# Patient Record
Sex: Male | Born: 2000 | Race: White | Hispanic: No | Marital: Single | Smoking: Current every day smoker
Health system: Southern US, Community
[De-identification: ages and names within clinical notes are randomized; demographics above are authoritative.]

## PROBLEM LIST (undated history)

## (undated) DIAGNOSIS — F329 Major depressive disorder, single episode, unspecified: Secondary | ICD-10-CM

## (undated) DIAGNOSIS — F419 Anxiety disorder, unspecified: Secondary | ICD-10-CM

## (undated) DIAGNOSIS — F32A Depression, unspecified: Secondary | ICD-10-CM

---
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
## 2021-01-20 ENCOUNTER — Ambulatory Visit
Admission: EM | Admit: 2021-01-20 | Discharge: 2021-01-20 | Disposition: A | Discharge status: Home / Self Care | Payer: Medicaid Other | Attending: Sports Medicine | Admitting: Sports Medicine

## 2021-01-20 ENCOUNTER — Other Ambulatory Visit: Payer: Self-pay

## 2021-01-20 ENCOUNTER — Encounter: Payer: Self-pay | Admitting: Emergency Medicine

## 2021-01-20 DIAGNOSIS — R350 Frequency of micturition: Secondary | ICD-10-CM | POA: Insufficient documentation

## 2021-01-20 DIAGNOSIS — R300 Dysuria: Secondary | ICD-10-CM | POA: Insufficient documentation

## 2021-01-20 LAB — URINALYSIS, COMPLETE (UACMP) WITH MICROSCOPIC
Bilirubin Urine: NEGATIVE
Glucose, UA: NEGATIVE mg/dL
Hgb urine dipstick: NEGATIVE
Ketones, ur: NEGATIVE mg/dL
Leukocytes,Ua: NEGATIVE
Nitrite: NEGATIVE
Protein, ur: NEGATIVE mg/dL
Specific Gravity, Urine: 1.025 (ref 1.005–1.030)
pH: 6 (ref 5.0–8.0)

## 2021-01-20 LAB — CHLAMYDIA/NGC RT PCR (ARMC ONLY)
Chlamydia Tr: NOT DETECTED
N gonorrhoeae: NOT DETECTED

## 2021-01-20 NOTE — Discharge Instructions (Signed)
The urinalysis was normal.  We will send the urine for culture to see if any bacteria grows.  We have also tested you for sexually transmitted infections and the results to be back later today.  We will give you a call once the results are in.  At this time, increase rest and fluids and you can consider taking over-the-counter AZO for symptoms.  If you have any worsening symptoms, especially worsening testicular pain you need to be seen again immediately.

## 2021-01-20 NOTE — ED Triage Notes (Signed)
Patient c/o burning when urinating and urinary frequency that started 2 days ago.  Patient denies blood in his urine.

## 2021-01-20 NOTE — ED Provider Notes (Addendum)
[facility]    CSN: [id number] Arrival date & time: 01/20/21  0807      History   Chief Complaint Chief Complaint  Patient presents with  . Urinary Frequency  . Dysuria    HPI Adam Mason is a 20 y.o. male presenting for approximately 2-day history of dysuria and urinary frequency.  He also admits to some mild right-sided testicular pain.  Patient denies any fever, fatigue, chills, abdominal/pelvic pain, back pain, hematuria, penile discharge, testicular swelling, skin lesions or rashes.  Patient says he believes he has a UTI.  He says he has had a UTI in the past and took amoxicillin for it.  He is sexually active with 1 male partner and they do not always use protection.  Patient states that his partner is "clean."  He has not taken any OTC meds for symptoms.  He has no other complaints or concerns.  HPI  Past Medical History:  Diagnosis Date  . Anxiety   . Depression     Patient Active Problem List   Diagnosis Date Noted  . 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 GAD (generalized anxiety disorder) 01/10/2015  . Suicidal ideation   . Acute nonsuppurative otitis media of right ear 06/08/2014  . URI (upper respiratory infection) 04/27/2014  . Well child check 11/17/2013    History reviewed. No pertinent surgical history.     Home Medications    Prior to Admission medications   Medication Sig Start Date End Date Taking? Authorizing Provider  cetirizine-pseudoephedrine (ZYRTEC-D) 5-120 MG tablet Take 1 tablet by mouth daily. 11/18/18   Wurst, [place], PA-C  fluticasone (FLONASE) 50 MCG/ACT nasal spray Place 2 sprays into both nostrils daily. 11/18/18   Rennis Harding, PA-C    Family History Family History  Problem Relation Age of Onset  . Kidney disease Mother        kidney stones  . Cancer Maternal Grandmother        breaast  . Kidney disease Maternal Grandmother        stones  . Diabetes Maternal Grandfather   . Asthma Sister    . Asthma Maternal Aunt     Social History Social History   Tobacco Use  . Smoking status: Never Smoker  . Smokeless tobacco: Never Used  Vaping Use  . Vaping Use: Every day  Substance Use Topics  . Alcohol use: No    Alcohol/week: 0.0 standard drinks  . Drug use: No     Allergies   Patient has no known allergies.   Review of Systems Review of Systems  Constitutional: Negative for fatigue and fever.  Gastrointestinal: Negative for abdominal pain, nausea and vomiting.  Genitourinary: Positive for dysuria, frequency and testicular pain. Negative for genital sores, hematuria, penile discharge, penile pain, penile swelling, scrotal swelling and urgency.  Musculoskeletal: Negative for arthralgias.  Skin: Negative for rash.     Physical Exam Triage Vital Signs ED Triage Vitals [01/20/21 0819]  Enc Vitals Group     BP      Pulse      Resp      Temp      Temp src      SpO2      Weight 230 lb (104.3 kg)     Height 6' (1.829 m)     Head Circumference      Peak Flow      Pain Score 3     Pain Loc      Pain Edu?  Excl. in GC?    No data found.  Updated Vital Signs BP 136/78 (BP Location: Left Arm)   Pulse 65   Temp 98.2 F (36.8 C) (Oral)   Resp 16   Ht 6' (1.829 m)   Wt 230 lb (104.3 kg)   SpO2 98%   BMI 31.19 kg/m       Physical Exam Vitals and nursing note reviewed.  Constitutional:      General: He is not in acute distress.    Appearance: Normal appearance. He is well-developed. He is not ill-appearing or diaphoretic.  HENT:     Head: Normocephalic and atraumatic.     Mouth/Throat:     Pharynx: Uvula midline.     Tonsils: No tonsillar abscesses.  Eyes:     General: No scleral icterus.       Right eye: No discharge.        Left eye: No discharge.     Conjunctiva/sclera: Conjunctivae normal.  Neck:     Thyroid: No thyromegaly.     Trachea: No tracheal deviation.  Cardiovascular:     Rate and Rhythm: Normal rate and regular rhythm.      Heart sounds: Normal heart sounds.  Pulmonary:     Effort: Pulmonary effort is normal. No respiratory distress.     Breath sounds: Normal breath sounds. No wheezing, rhonchi or rales.  Abdominal:     Palpations: Abdomen is soft.     Tenderness: There is no abdominal tenderness. There is no right CVA tenderness or left CVA tenderness.  Musculoskeletal:     Cervical back: Neck supple.  Skin:    General: Skin is warm and dry.     Findings: No rash.  Neurological:     General: No focal deficit present.     Mental Status: He is alert. Mental status is at baseline.     Motor: No weakness.     Gait: Gait normal.  Psychiatric:        Mood and Affect: Mood normal.        Behavior: Behavior normal.        Thought Content: Thought content normal.      UC Treatments / Results  Labs (all labs ordered are listed, but only abnormal results are displayed) Labs Reviewed  URINALYSIS, COMPLETE (UACMP) WITH MICROSCOPIC - Abnormal; Notable for the following components:      Result Value   Bacteria, UA FEW (*)    All other components within normal limits  URINE CULTURE  CHLAMYDIA/NGC RT PCR ([facility] ONLY)  TRICHOMONAS VAGINALIS, PROBE AMP    EKG   Radiology No results found.  Procedures Procedures (including critical care time)  Medications Ordered in UC Medications - No data to display  Initial Impression / Assessment and Plan / UC Course  I have reviewed the triage vital signs and the nursing notes.  Pertinent labs & imaging results that were available during my care of the patient were reviewed by me and considered in my medical decision making (see chart for details).   20 year old male presenting with dysuria and urinary frequency x2 days. Patient also admits to some mild right-sided testicular pain.  He states he has had this similar symptoms in the past and has had a UTI and epididymitis.  Patient without any concerns for STIs.  He did decline a GU exam today.  Advised  him that if the testicular pain worsens he should reconsider.  Urinalysis today is negative for any nitrites, blood, leukocytes.  We will send urine for culture.  Also testing for GC/chlamydia/trichomonas.  At this time, advised OTC AZO and increasing rest and fluids.  Reviewed ED precautions with patient.  Advised may consider going ahead and treating him for epididymitis if his STI screen is negative today.  Work note given.   Final Clinical Impressions(s) / UC Diagnoses   Final diagnoses:  Dysuria  Urinary frequency     Discharge Instructions     The urinalysis was normal.  We will send the urine for culture to see if any bacteria grows.  We have also tested you for sexually transmitted infections and the results to be back later today.  We will give you a call once the results are in.  At this time, increase rest and fluids and you can consider taking over-the-counter AZO for symptoms.  If you have any worsening symptoms, especially worsening testicular pain you need to be seen again immediately.    ED Prescriptions    None     PDMP not reviewed this encounter.   Shirlee Latch, PA-C 01/20/21 0849    Shirlee Latch, PA-C 01/20/21 [phone]

## 2021-01-21 LAB — URINE CULTURE: Culture: NO GROWTH

## 2021-01-24 LAB — TRICHOMONAS VAGINALIS, PROBE AMP: Trich vag by NAA: NEGATIVE

## 2021-10-30 ENCOUNTER — Telehealth: Payer: Self-pay

## 2021-10-30 NOTE — Telephone Encounter (Signed)
Left vm to confirm 11/01/21 appointment-Toni °

## 2021-11-01 ENCOUNTER — Ambulatory Visit (INDEPENDENT_AMBULATORY_CARE_PROVIDER_SITE_OTHER): Payer: 59 | Admitting: Nurse Practitioner

## 2021-11-01 ENCOUNTER — Encounter: Payer: Self-pay | Admitting: Nurse Practitioner

## 2021-11-01 ENCOUNTER — Other Ambulatory Visit: Payer: Self-pay

## 2021-11-01 VITALS — BP 136/86 | HR 80 | Temp 98.0°F | Resp 16 | Ht 72.0 in | Wt 230.0 lb

## 2021-11-01 DIAGNOSIS — G4700 Insomnia, unspecified: Secondary | ICD-10-CM | POA: Diagnosis not present

## 2021-11-01 DIAGNOSIS — Z789 Other specified health status: Secondary | ICD-10-CM

## 2021-11-01 DIAGNOSIS — F411 Generalized anxiety disorder: Secondary | ICD-10-CM

## 2021-11-01 DIAGNOSIS — D649 Anemia, unspecified: Secondary | ICD-10-CM | POA: Diagnosis not present

## 2021-11-01 DIAGNOSIS — E782 Mixed hyperlipidemia: Secondary | ICD-10-CM | POA: Diagnosis not present

## 2021-11-01 DIAGNOSIS — Z7689 Persons encountering health services in other specified circumstances: Secondary | ICD-10-CM

## 2021-11-01 DIAGNOSIS — E559 Vitamin D deficiency, unspecified: Secondary | ICD-10-CM

## 2021-11-01 MED ORDER — BELSOMRA 10 MG PO TABS: 10.0000 mg | ORAL_TABLET | Freq: Every evening | ORAL | 2 refills | Status: DC | PRN

## 2021-11-01 NOTE — Progress Notes (Unsigned)
[facility] [place], [place] [address]  Internal MEDICINE  Office Visit Note  Patient Name: Adam Mason  [id number]  [id number]  Date of Service: 11/01/2021   Complaints/HPI Pt is here for establishment of PCP. Chief Complaint  Patient presents with   New Patient (Initial Visit)    Difficulty staying asleep, will only sleep for 2 or 3 hours   Anxiety   Depression    HPI Alfonso presents for a new patient visit to establish care. 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  Diet: improving, trying to lose weight, increased protein, smaller portions. Does not eat out much anymore Exercise: go to gym and work is physically demanding Substances: vape, no smoking, 2-3 times per week, 1-2 glasses of whiskey, stopped taking marijuana, was pretty heavy smoker.  Pain: no pain  Questions or concerns: Age appropriate screenings: Routine labs Difficulty sleeping, staying asleeping going back to sleep, sleeps about 2-3 hours at a time.  Tried benadryl and melatonin, doxylamine and cough syrup.   Current Medication: Outpatient Encounter Medications as of 11/01/2021  Medication Sig   Suvorexant (BELSOMRA) 10 MG TABS Take 10 mg by mouth at bedtime as needed (insomnia). Take 30 minute before bedtime.   [DISCONTINUED] cetirizine-pseudoephedrine (ZYRTEC-D) 5-120 MG tablet Take 1 tablet by mouth daily. (Patient not taking: Reported on 11/01/2021)   [DISCONTINUED] fluticasone (FLONASE) 50 MCG/ACT nasal spray Place 2 sprays into both nostrils daily. (Patient not taking: Reported on 11/01/2021)   No facility-administered encounter medications on file as of 11/01/2021.    Surgical History: History reviewed. No pertinent surgical history.  Medical History: Past Medical History:  Diagnosis Date   Anxiety    Depression     Family  History: Family History  Problem Relation Age of Onset   Kidney disease Mother        kidney stones   Cancer Maternal Grandmother        breaast   Kidney disease Maternal Grandmother        stones   Diabetes Maternal Grandfather    Asthma Sister    Asthma Maternal Aunt     Social History   Socioeconomic History   Marital status: Single    Spouse name: Not on file   Number of children: Not on file   Years of education: Not on file   Highest education level: Not on file  Occupational History   Not on file  Tobacco Use   Smoking status: Every Day    Types: E-cigarettes   Smokeless tobacco: Never  Vaping Use   Vaping Use: Every day  Substance and Sexual Activity   Alcohol use: No    Alcohol/week: 0.0 standard drinks   Drug use: Not Currently    Types: Marijuana   Sexual activity: Not Currently    Birth control/protection: None  Other Topics Concern   Not on file  Social History Narrative   Not on file   Social Determinants of Health   Financial Resource Strain: Not on file  Food Insecurity: Not on file  Transportation Needs: Not on file  Physical Activity: Not on file  Stress: Not on file  Social Connections: Not on file  Intimate Partner Violence: Not on file     Review of Systems  Constitutional:  Negative for chills, fatigue and unexpected weight change.  HENT:  Negative for congestion, rhinorrhea, sneezing  and sore throat.   Eyes:  Negative for redness.  Respiratory:  Negative for cough, chest tightness and shortness of breath.   Cardiovascular:  Negative for chest pain and palpitations.  Gastrointestinal:  Negative for abdominal pain, constipation, diarrhea, nausea and vomiting.  Genitourinary:  Negative for dysuria and frequency.  Musculoskeletal:  Negative for arthralgias, back pain, joint swelling and neck pain.  Skin:  Negative for rash.  Neurological: Negative.  Negative for tremors and numbness.  Hematological:  Negative for  adenopathy. Does not bruise/bleed easily.  Psychiatric/Behavioral:  Positive for depression. Negative for behavioral problems (Depression), sleep disturbance and suicidal ideas. The patient is not nervous/anxious.    Vital Signs: BP 136/86    Pulse 80    Temp 98 F (36.7 C)    Resp 16    Ht 6' (1.829 m)    Wt 230 lb (104.3 kg)    SpO2 98%    BMI 31.19 kg/m    Physical Exam Vitals reviewed.  Constitutional:      General: He is not in acute distress.    Appearance: Normal appearance. He is obese. He is not ill-appearing.  HENT:     Head: Normocephalic and atraumatic.  Eyes:     Pupils: Pupils are equal, round, and reactive to light.  Cardiovascular:     Rate and Rhythm: Normal rate and regular rhythm.  Pulmonary:     Effort: Pulmonary effort is normal. No respiratory distress.  Neurological:     Mental Status: He is alert and oriented to person, place, and time.  Psychiatric:        Mood and Affect: Mood normal.        Behavior: Behavior normal.      Assessment/Plan: There are no diagnoses linked to this encounter.   General Counseling: alic hilburn understanding of the findings of todays visit and agrees with plan of treatment. I have discussed any further diagnostic evaluation that may be needed or ordered today. We also reviewed his medications today. he has been encouraged to call the office with any questions or concerns that should arise related to todays visit.    Counseling:  [place] Controlled Substance Database was reviewed by me.  Orders Placed This Encounter  Procedures   CBC with Differential/Platelet   CMP14+EGFR   Lipid Profile   Vitamin D (25 hydroxy)    Meds ordered this encounter  Medications   Suvorexant (BELSOMRA) 10 MG TABS    Sig: Take 10 mg by mouth at bedtime as needed (insomnia). Take 30 minute before bedtime.    Dispense:  30 tablet    Refill:  2    Return for CPE,  PCP at earliest available opening. .  Time spent:30  Minutes  This patient was seen by Jonetta Osgood, FNP-C in collaboration with Dr. Clayborn Bigness as a part of collaborative care agreement.     R. Valetta Fuller, MSN, FNP-C Internal Medicine

## 2021-11-06 ENCOUNTER — Telehealth: Payer: Self-pay

## 2021-11-06 ENCOUNTER — Other Ambulatory Visit: Payer: Self-pay

## 2021-11-06 MED ORDER — TRAZODONE HCL 50 MG PO TABS: 50.0000 mg | ORAL_TABLET | Freq: Every day | ORAL | 0 refills | Status: DC

## 2021-11-06 NOTE — Telephone Encounter (Signed)
Pt notified that we send trazodone see other note

## 2021-11-08 ENCOUNTER — Telehealth: Payer: Self-pay

## 2021-11-10 NOTE — Telephone Encounter (Signed)
Pt informed that his leeter was ready and can pick up on Monday at front desk ?

## 2021-12-03 ENCOUNTER — Other Ambulatory Visit: Payer: Self-pay | Admitting: Nurse Practitioner

## 2021-12-12 ENCOUNTER — Telehealth: Payer: Self-pay

## 2021-12-12 ENCOUNTER — Encounter: Payer: 59 | Admitting: Nurse Practitioner

## 2021-12-12 DIAGNOSIS — Z0289 Encounter for other administrative examinations: Secondary | ICD-10-CM

## 2021-12-12 NOTE — Telephone Encounter (Signed)
Left voicemail for patient to call and reschedule missed office visit on 12/12/21. ?

## 2023-02-03 ENCOUNTER — Emergency Department
Admission: EM | Admit: 2023-02-03 | Discharge: 2023-02-04 | Disposition: A | Discharge status: Other Acute Inpatient Hospital | Payer: 59 | Attending: Emergency Medicine | Admitting: Emergency Medicine

## 2023-02-03 ENCOUNTER — Other Ambulatory Visit: Payer: Self-pay

## 2023-02-03 DIAGNOSIS — F17210 Nicotine dependence, cigarettes, uncomplicated: Secondary | ICD-10-CM | POA: Insufficient documentation

## 2023-02-03 DIAGNOSIS — R4689 Other symptoms and signs involving appearance and behavior: Secondary | ICD-10-CM

## 2023-02-03 DIAGNOSIS — F411 Generalized anxiety disorder: Secondary | ICD-10-CM | POA: Diagnosis present

## 2023-02-03 DIAGNOSIS — F4323 Adjustment disorder with mixed anxiety and depressed mood: Secondary | ICD-10-CM | POA: Insufficient documentation

## 2023-02-03 LAB — URINE DRUG SCREEN, QUALITATIVE (ARMC ONLY)
Amphetamines, Ur Screen: NOT DETECTED
Barbiturates, Ur Screen: NOT DETECTED
Benzodiazepine, Ur Scrn: NOT DETECTED
Cannabinoid 50 Ng, Ur ~~LOC~~: POSITIVE — AB
Cocaine Metabolite,Ur ~~LOC~~: NOT DETECTED
MDMA (Ecstasy)Ur Screen: NOT DETECTED
Methadone Scn, Ur: NOT DETECTED
Opiate, Ur Screen: NOT DETECTED
Phencyclidine (PCP) Ur S: NOT DETECTED
Tricyclic, Ur Screen: NOT DETECTED

## 2023-02-03 LAB — CBC
HCT: 49.7 % (ref 39.0–52.0)
Hemoglobin: 16.8 g/dL (ref 13.0–17.0)
MCH: 30 pg (ref 26.0–34.0)
MCHC: 33.8 g/dL (ref 30.0–36.0)
MCV: 88.8 fL (ref 80.0–100.0)
Platelets: 244 10*3/uL (ref 150–400)
RBC: 5.6 MIL/uL (ref 4.22–5.81)
RDW: 11.9 % (ref 11.5–15.5)
WBC: 14 10*3/uL — ABNORMAL HIGH (ref 4.0–10.5)
nRBC: 0 % (ref 0.0–0.2)

## 2023-02-03 LAB — COMPREHENSIVE METABOLIC PANEL
ALT: 24 U/L (ref 0–44)
AST: 22 U/L (ref 15–41)
Albumin: 5 g/dL (ref 3.5–5.0)
Alkaline Phosphatase: 58 U/L (ref 38–126)
Anion gap: 8 (ref 5–15)
BUN: 15 mg/dL (ref 6–20)
CO2: 25 mmol/L (ref 22–32)
Calcium: 9.2 mg/dL (ref 8.9–10.3)
Chloride: 103 mmol/L (ref 98–111)
Creatinine, Ser: 0.9 mg/dL (ref 0.61–1.24)
GFR, Estimated: >60 mL/min (ref >60)
Glucose, Bld: 124 mg/dL — ABNORMAL HIGH (ref 70–99)
Potassium: 3.9 mmol/L (ref 3.5–5.1)
Sodium: 136 mmol/L (ref 135–145)
Total Bilirubin: 0.8 mg/dL (ref 0.3–1.2)
Total Protein: 7.9 g/dL (ref 6.5–8.1)

## 2023-02-03 LAB — ETHANOL: Alcohol, Ethyl (B): <10 mg/dL (ref <10)

## 2023-02-03 LAB — SALICYLATE LEVEL: Salicylate Lvl: <7 mg/dL — ABNORMAL LOW (ref 7.0–30.0)

## 2023-02-03 LAB — ACETAMINOPHEN LEVEL: Acetaminophen (Tylenol), Serum: <10 ug/mL — ABNORMAL LOW (ref 10–30)

## 2023-02-03 MED ORDER — NICOTINE 21 MG/24HR TD PT24
21.0000 mg | MEDICATED_PATCH | Freq: Once | TRANSDERMAL | Status: DC
  Administered 2023-02-03: 21 mg via TRANSDERMAL

## 2023-02-03 NOTE — BH Assessment (Signed)
Comprehensive Clinical Assessment (CCA) Note  02/03/2023 Adam Mason [id number]  Chief Complaint:  Chief Complaint  Patient presents with   Psychiatric Evaluation    IVC   Visit Diagnosis: Adjustment Disorder   Adam Mason is a 22 year old male who presents to the ER via law enforcement, under IVC. Per the IVC, the patient threatened to end his life, by shooting his self with his gun. Per the patient, he never said it. He went to his girlfriend house because she ended their relationship, via a text message. He stated he wanted her to tell him face-to-face it was over. However, her parents told him to leave, so he left. Patient states, he was confused about the text, because he recently spent a great deal of money on her, when they were out of town together. Last night the discussed plans about moving in with each other. After the patient left her home, he went to his best friend's house, to spend time with him and his family. He states he never had the thought of hurting his self and never said anything that would cause anyone to believe he would.  Per the permission of the patient, writer contacted the friend Adam Mason [phone]) and he confirmed the recent breakup. As well as confirming the patient came to his home, after leaving the girlfriend's home. He shared the patient was sad about the breakup, which he understood why he would be. "It wasn't nothing outside of would be normal breakup. He was hurt." He reports, the patient never said anything about harming himself or anyone. He further reported, he has no concerns of the patient doing anything to harm him self or anyone else. "In fact, we made plans to go to [facility] next weekend to get the rest of my stuff and move it down here ([place])." Per the friend, they have known each other for approximately four years and they are extremely close. He's known the friend's wife since middle school and they are close as well. They talk two to three times a  week via phone. They see each other a minimum of once a week, to hangout. "He's even my kid's Godfather."  During the interview, the patient was calm, cooperative and pleasant. He was able to provide appropriate answers to the questions. Throughout the interview, he denied SI/HI and AV/H. He acknowledges his history of past inpatient treatments. He also acknowledges his THC use and denies the use of anything else. UDS reflects the same. Patient voice his concern about having to stay at the hospital and getting billed for it. His grandfather passed approximately three months ago, and he is living in his home. The hospital bills for the grandfather are still coming to the house, and he is trying to pay but he recently lost his job. The company went out of business.  CCA Screening, Triage and Referral (STR)  Patient Reported Information How did you hear about [place]? Family/Friend  What Is the Reason for Your Visit/Call Today? Per IVC, patient voiced SI with plan to use his gun.  How Long Has This Been Causing You Problems? <Week  What Do You Feel Would Help You the Most Today? Treatment for Depression or other mood problem   Have You Recently Had Any Thoughts About Hurting Yourself? No  Are You Planning to Commit Suicide/Harm Yourself At This time? No   Flowsheet Row ED from 02/03/2023 in [facility] Emergency Department at [facility] ED from 01/20/2021 in [facility] Urgent Care at [facility]  C-SSRS RISK CATEGORY No Risk No Risk       Have you Recently Had Thoughts About Hurting Someone Adam Mason? No  Are You Planning to Harm Someone at This Time? No  Explanation: No data recorded  Have You Used Any Alcohol or Drugs in the Past 24 Hours? Yes  What Did You Use and How Much? Cannabis  Do You Currently Have a Therapist/Psychiatrist? No  Name of Therapist/Psychiatrist:    Have You Been Recently Discharged From Any Office Practice or Programs? No  Explanation of Discharge From  Practice/Program: No data recorded    CCA Screening Triage Referral Assessment Type of Contact: Face-to-Face  Telemedicine Service Delivery:   Is this Initial or Reassessment?   Date Telepsych consult ordered in [facility]:    Time Telepsych consult ordered in [facility]:    Location of Assessment: [facility] ED  Provider Location: [facility] ED  Collateral Involvement: No data recorded  Does Patient Have a Court Appointed Legal Guardian? No  Legal Guardian Contact Information: No data recorded Copy of Legal Guardianship Form: No data recorded Legal Guardian Notified of Arrival: No data recorded Legal Guardian Notified of Pending Discharge: No data recorded If Minor and Not Living with Parent(s), Who has Custody? No data recorded Is CPS involved or ever been involved? Never  Is APS involved or ever been involved? Never  Patient Determined To Be At Risk for Harm To Self or Others Based on Review of Patient Reported Information or Presenting Complaint? No  Method: No data recorded Availability of Means: No data recorded Intent: No data recorded Notification Required: No data recorded Additional Information for Danger to Others Potential: No data recorded Additional Comments for Danger to Others Potential: No data recorded Are There Guns or Other Weapons in Your Home? Yes  Types of Guns/Weapons: Own four fire arms.  Are These Weapons Safely Secured?                            No data recorded Who Could Verify You Are Able To Have These Secured: No data recorded Do You Have any Outstanding Charges, Pending Court Dates, Parole/Probation? No data recorded Contacted To Inform of Risk of Harm To Self or Others: No data recorded  Does Patient Present under Involuntary Commitment? Yes  [place] of Residence: [place]  Patient Currently Receiving the Following Services: Not Receiving Services   Determination of Need: Emergent (2 hours)   Options For Referral: ED Visit   CCA Biopsychosocial Patient  Reported Schizophrenia/Schizoaffective Diagnosis in Past: No   Strengths: Have stable housing, have support system, and polite.   Mental Health Symptoms Depression:   Tearfulness (Due to recent breakup, today.)   Duration of Depressive symptoms:  Duration of Depressive Symptoms: Less than two weeks   Mania:   None   Anxiety:    N/A   Psychosis:   None   Duration of Psychotic symptoms:    Trauma:   N/A   Obsessions:   N/A   Compulsions:   N/A   Inattention:   N/A   Hyperactivity/Impulsivity:   N/A   Oppositional/Defiant Behaviors:   N/A   Emotional Irregularity:   N/A   Other Mood/Personality Symptoms:  No data recorded   Mental Status Exam Appearance and self-care  Stature:   Average   Weight:   Average weight   Clothing:   Age-appropriate; Neat/clean   Grooming:   Normal   Cosmetic use:   None  Posture/gait:   Normal   Motor activity:   -- (Within normal range.)   Sensorium  Attention:   Normal   Concentration:   Normal   Orientation:   X5   Recall/memory:   Normal   Affect and Mood  Affect:   Anxious (Because he is in the ER.)   Mood:  No data recorded  Relating  Eye contact:   Normal   Facial expression:   Anxious; Responsive (Because he is in the ER.)   Attitude toward examiner:   Cooperative   Thought and Language  Speech flow:  Clear and Coherent; Normal   Thought content:   Appropriate to Mood and Circumstances   Preoccupation:   None   Hallucinations:   None   Organization:   Intact; Coherent; Development worker, international aid of Knowledge:   Average   Intelligence:   Average   Abstraction:   Normal   Judgement:   Normal   Reality TestingNaval architect; Adequate   Insight:   Fair   Decision Making:   Normal   Social Functioning  Social Maturity:   Responsible   Social Judgement:   Normal   Stress  Stressors:   Relationship   Coping Ability:   Normal   Skill  Deficits:   None   Supports:   Family; Friends/Service system     Religion: Religion/Spirituality Are You A Religious Person?: Yes What is Your Religious Affiliation?: Chiropodist: Leisure / Recreation Do You Have Hobbies?: Yes  Exercise/Diet: Exercise/Diet Do You Exercise?: No Have You Gained or Lost A Significant Amount of Weight in the Past Six Months?: No Do You Follow a Special Diet?: No Do You Have Any Trouble Sleeping?: Yes Explanation of Sleeping Difficulties: Ongoing problems with falling and staying asleep.   CCA Employment/Education Employment/Work Situation: Employment / Work Situation Employment Situation: Unemployed Patient's Job has Been Impacted by Current Illness: No Has Patient ever Been in Equities trader?: No  Education: Education Is Patient Currently Attending School?: No Did Theme park manager?: No Did You Have An Individualized Education Program (IIEP): No Did You Have Any Difficulty At [employer]?: No Patient's Education Has Been Impacted by Current Illness: No   CCA Family/Childhood History Family and Relationship History: Family history Marital status: Single Does patient have children?: No  Childhood History:  Childhood History By whom was/is the patient raised?: Both parents Did patient suffer any verbal/emotional/physical/sexual abuse as a child?: No Did patient suffer from severe childhood neglect?: No Has patient ever been sexually abused/assaulted/raped as an adolescent or adult?: No Was the patient ever a victim of a crime or a disaster?: No Witnessed domestic violence?: No Has patient been affected by domestic violence as an adult?: No   CCA Substance Use Alcohol/Drug Use: Alcohol / Drug Use Pain Medications: See PTA Prescriptions: See PTA Over the Counter: See PTA History of alcohol / drug use?: Yes Longest period of sobriety (when/how long): Unable to quantify Substance #1 Name of Substance 1:  Cannabis 1 - Last Use / Amount: "45 days ago"   ASAM's:  Six Dimensions of Multidimensional Assessment  Dimension 1:  Acute Intoxication and/or Withdrawal Potential:      Dimension 2:  Biomedical Conditions and Complications:      Dimension 3:  Emotional, Behavioral, or Cognitive Conditions and Complications:     Dimension 4:  Readiness to Change:     Dimension 5:  Relapse, Continued use, or Continued Problem Potential:  Dimension 6:  Recovery/Living Environment:     ASAM Severity Score:    ASAM Recommended Level of Treatment:     Substance use Disorder (SUD)    Recommendations for Services/Supports/Treatments:    Discharge Disposition:    DSM5 Diagnoses: Patient Active Problem List   Diagnosis Date Noted   Severe recurrent major depression (HCC) 04/10/2015   GAD (generalized anxiety disorder) 01/10/2015   Suicidal ideation    Acute nonsuppurative otitis media of right ear 06/08/2014   URI (upper respiratory infection) 04/27/2014   Well child check 11/17/2013     Referrals to Alternative Service(s): Referred to Alternative Service(s):   Place:   Date:   Time:    Referred to Alternative Service(s):   Place:   Date:   Time:    Referred to Alternative Service(s):   Place:   Date:   Time:    Referred to Alternative Service(s):   Place:   Date:   Time:     Lilyan Gilford MS, LCAS, [facility] Therapeutic Triage Specialist 02/03/2023 7:15 PM

## 2023-02-03 NOTE — Discharge Instructions (Signed)

## 2023-02-03 NOTE — ED Notes (Signed)
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

## 2023-02-03 NOTE — ED Provider Notes (Signed)
[facility] Provider Note    Event Date/Time   First MD Initiated Contact with Patient 02/03/23 1709     (approximate)   History   Psychiatric Evaluation (IVC)   HPI  Adam Mason is a 22 y.o. male with a history of anxiety and depression who presents under involuntary commitment after apparently threatening to shoot himself with a rifle.  The patient states that he got into an argument with his girlfriend after she broke up with him this morning.  The patient became upset and was arguing with her.  He states that he threatened to kill himself but denies saying anything about a rifle.  He states that he just said this but does not actually feel suicidal.  He denies any active SI or HI at this time.  He states that he has a prior history of self-harm but has not done this in a long time.  He denies any acute medical complaints.  I reviewed the past medical records.  The patient's most recent outpatient encounter was with internal medicine on 2/22 of last year for establishment of a new primary care provider.  He has no recent inpatient psychiatric hospitalizations or ED visits.   Physical Exam   Triage Vital Signs: ED Triage Vitals  Enc Vitals Group     BP 02/03/23 1700 (!) 147/89     Pulse Rate 02/03/23 1700 100     Resp 02/03/23 1700 16     Temp 02/03/23 1700 98.6 F (37 C)     Temp Source 02/03/23 1700 Oral     SpO2 02/03/23 1700 95 %     Weight 02/03/23 1701 200 lb (90.7 kg)     Height 02/03/23 1701 6' (1.829 m)     Head Circumference --      Peak Flow --      Pain Score 02/03/23 1700 0     Pain Loc --      Pain Edu? --      Excl. in GC? --     Most recent vital signs: Vitals:   02/03/23 1700 02/03/23 1951  BP: (!) 147/89 132/82  Pulse: 100 82  Resp: 16 18  Temp: 98.6 F (37 C) 98.7 F (37.1 C)  SpO2: 95% 100%     General: Awake, no distress. CV:  Good peripheral perfusion.  Resp:  Normal effort.  Abd:  No distention.   Other:  Calm and cooperative.   ED Results / Procedures / Treatments   Labs (all labs ordered are listed, but only abnormal results are displayed) Labs Reviewed  COMPREHENSIVE METABOLIC PANEL - Abnormal; Notable for the following components:      Result Value   Glucose, Bld 124 (*)    All other components within normal limits  SALICYLATE LEVEL - Abnormal; Notable for the following components:   Salicylate Lvl <7.0 (*)    All other components within normal limits  ACETAMINOPHEN LEVEL - Abnormal; Notable for the following components:   Acetaminophen (Tylenol), Serum <10 (*)    All other components within normal limits  CBC - Abnormal; Notable for the following components:   WBC 14.0 (*)    All other components within normal limits  URINE DRUG SCREEN, QUALITATIVE ([facility] ONLY) - Abnormal; Notable for the following components:   Cannabinoid 50 Ng, Ur [place] POSITIVE (*)    All other components within normal limits  ETHANOL     EKG     RADIOLOGY    PROCEDURES:  Critical Care performed: No  Procedures   MEDICATIONS ORDERED IN ED: Medications  nicotine (NICODERM CQ - dosed in mg/24 hours) patch 21 mg (21 mg Transdermal Patch Applied 02/03/23 2056)     IMPRESSION / MDM / ASSESSMENT AND PLAN / ED COURSE  I reviewed the triage vital signs and the nursing notes.  22 year old male presents under involuntary commitment by the police after he apparently made a statement threatening suicide during an argument with his girlfriend.  The patient denies active SI or HI now.  Differential diagnosis includes, but is not limited to, major depressive disorder, adjustment disorder, substance-induced mood disorder.  I have ordered psychiatry and TTS consults and lab workup for medical clearance.  Patient's presentation is most consistent with acute presentation with potential threat to life or bodily function.   The patient has been placed in psychiatric observation due to the need to  provide a safe environment for the patient while obtaining psychiatric consultation and evaluation, as well as ongoing medical and medication management to treat the patient's condition.  The patient has been placed under full IVC at this time.   ----------------------------------------- 11:30 PM on 02/03/2023 -----------------------------------------  I consulted and discussed case with NP Dixon from psychiatry who is evaluating the patient.  I have signed him out to the oncoming ED physician Dr. York Cerise.  Workup is unremarkable.  Ethanol, APAP, and ASA levels are all negative.  UDS is positive for cannabinoids only.   FINAL CLINICAL IMPRESSION(S) / ED DIAGNOSES   Final diagnoses:  Behavior concern     Rx / DC Orders   ED Discharge Orders     None        Note:  This document was prepared using Dragon voice recognition software and may include unintentional dictation errors.    Dionne Bucy, MD 02/03/23 [phone]

## 2023-02-03 NOTE — ED Notes (Signed)
Pt. Alert and oriented, warm and dry, in no distress. Pt. Denies SI, HI, and AVH. Pt is tearful and not understanding why he is here. Per patient he denies ever saying anything about wanting to hurt himself.  Pt. Encouraged to let nursing staff know of any concerns or needs.   ENVIRONMENTAL ASSESSMENT Potentially harmful objects out of patient reach: Yes.   Personal belongings secured: Yes.   Patient dressed in hospital provided attire only: Yes.   Plastic bags out of patient reach: Yes.   Patient care equipment (cords, cables, call bells, lines, and drains) shortened, removed, or accounted for: Yes.   Equipment and supplies removed from bottom of stretcher: Yes.   Potentially toxic materials out of patient reach: Yes.   Sharps container removed or out of patient reach: Yes.

## 2023-02-03 NOTE — ED Triage Notes (Addendum)
Pt to ED from home via [employer] under IVC. Pt was involved in a domestic incident where he threatened to shoot himself with a riffle.  Pt is CAOx4, in no acute distress and ambulatory in triage.  Pt states he just broke up with his GF. Pt denies SI and HI.Pt stated some things at home out of "high emotions" per the pt.

## 2023-02-03 NOTE — BH Assessment (Signed)
Writer spoke with the patient to complete an updated/reassessment. Patient denies SI/HI and AV/H.  DISPOSITION UPDATE: Per Rashaun D., NP pt is psych cleared and can be discharged home.

## 2023-02-03 NOTE — ED Notes (Signed)
Sandwich tray and drink given.  

## 2023-02-04 DIAGNOSIS — F411 Generalized anxiety disorder: Secondary | ICD-10-CM

## 2023-02-04 DIAGNOSIS — F4323 Adjustment disorder with mixed anxiety and depressed mood: Secondary | ICD-10-CM

## 2023-02-04 DIAGNOSIS — R4689 Other symptoms and signs involving appearance and behavior: Secondary | ICD-10-CM

## 2023-02-04 NOTE — ED Notes (Signed)
Belongings returned to patient. Patient dressed into his clothing out of behavioral scrubs.  Discharge paperwork reviewed with patient. Patient voiced understanding of discharge paperwork.

## 2023-02-04 NOTE — Consult Note (Signed)
[facility] Face-to-Face Psychiatry Consult   Reason for Consult:  Psychaitric Evaluation Referring Physician:  Dr. Marisa Severin Patient Identification: Adam Mason MRN:  [id number] Principal Diagnosis: Adjustment disorder with mixed anxiety and depressed mood Diagnosis:  Principal Problem:   Adjustment disorder with mixed anxiety and depressed mood Active Problems:   GAD (generalized anxiety disorder)   Behavior concern   Total Time spent with patient: 30 minutes  Subjective:   " I am no where near suicidal"  HPI:  Psych Assessment  Percival Spanish, 22 y.o., male patient seen  by TTS and this provider; chart reviewed and consulted with Dr. Marisa Severin on 02/04/23.  On evaluation Adam Mason reports that he recently broke up wit his girlfriend. He admits to "saying things he should not have said".  Patient was a little reluctant to repeat what "those" things were.  However, he finally stated that on Thursday he admitted to saying that "If you leave me, I going to kill myself."  He admits to feeling "hurt" over the break-up, but is adamant that he would never kill himself.  He says he is the oldest of seven siblings and that suicide is against his and his families religion. He also, states that he and his friennds have made plans to go to the beach and that he was processing the break-up.  He states that he was very surprised when the police picked him up and brought him to the hospital.    Per  chart review, TTS states , Per the IVC, the patient threatened to end his life, by shooting his self with his gun. Per the patient, he never said it. He went to his girlfriend house because she ended their relationship, via a text message. He stated he wanted her to tell him face-to-face it was over. However, her parents told him to leave, so he left. Patient states, he was confused about the text, because he recently spent a great deal of money on her, when they were out of town together. Last night the discussed plans  about moving in with each other. After the patient left her home, he went to his best friend's house, to spend time with him and his family. He states he never had the thought of hurting his self and never said anything that would cause anyone to believe he would.   Per the permission of the patient, writer contacted the friend Wanita Chamberlain [phone]) and he confirmed the recent breakup. As well as confirming the patient came to his home, after leaving the girlfriend's home. He shared the patient was sad about the breakup, which he understood why he would be. "It wasn't nothing outside of would be normal breakup. He was hurt." He reports, the patient never said anything about harming himself or anyone. He further reported, he has no concerns of the patient doing anything to harm him self or anyone else. "In fact, we made plans to go to [facility] next weekend to get the rest of my stuff and move it down here ([place])." Per the friend, they have known each other for approximately four years and they are extremely close. He's known the friend's wife since middle school and they are close as well. They talk two to three times a week via phone. They see each other a minimum of once a week, to hangout. "He's even my kid's Godfather."   During the interview, the patient was calm, cooperative and pleasant. He was able to provide appropriate answers to the questions. Throughout the  interview, he denied SI/HI and AV/H. He acknowledges his history of past inpatient treatments. He also acknowledges his THC use and denies the use of anything else. UDS reflects the same. Patient voice his concern about having to stay at the hospital and getting billed for it. His grandfather passed approximately three months ago, and he is living in his home. The hospital bills for the grandfather are still coming to the house, and he is trying to pay but he recently lost his job. The company went out of business.  Patient is psychiatrically  cleared   Past Psychiatric History: Adjustment disorder  Risk to Self:   Risk to Others:   Prior Inpatient Therapy:   Prior Outpatient Therapy:    Past Medical History:  Past Medical History:  Diagnosis Date   Anxiety    Depression    History reviewed. No pertinent surgical history. Family History:  Family History  Problem Relation Age of Onset   Kidney disease Mother        kidney stones   Cancer Maternal Grandmother        breaast   Kidney disease Maternal Grandmother        stones   Diabetes Maternal Grandfather    Asthma Sister    Asthma Maternal Aunt    Family Psychiatric  History: unknown Social History:  Social History   Substance and Sexual Activity  Alcohol Use No   Alcohol/week: 0.0 standard drinks of alcohol     Social History   Substance and Sexual Activity  Drug Use Not Currently   Types: Marijuana    Social History   Socioeconomic History   Marital status: Single    Spouse name: Not on file   Number of children: Not on file   Years of education: Not on file   Highest education level: Not on file  Occupational History   Not on file  Tobacco Use   Smoking status: Every Day    Types: E-cigarettes   Smokeless tobacco: Never  Vaping Use   Vaping Use: Every day  Substance and Sexual Activity   Alcohol use: No    Alcohol/week: 0.0 standard drinks of alcohol   Drug use: Not Currently    Types: Marijuana   Sexual activity: Not Currently    Birth control/protection: None  Other Topics Concern   Not on file  Social History Narrative   Not on file   Social Determinants of Health   Financial Resource Strain: Not on file  Food Insecurity: Not on file  Transportation Needs: Not on file  Physical Activity: Not on file  Stress: Not on file  Social Connections: Not on file   Additional Social History:    Allergies:  No Known Allergies  Labs:  Results for orders placed or performed during the hospital encounter of 02/03/23 (from the  past 48 hour(s))  Comprehensive metabolic panel     Status: Abnormal   Collection Time: 02/03/23  4:59 PM  Result Value Ref Range   Sodium 136 135 - 145 mmol/L   Potassium 3.9 3.5 - 5.1 mmol/L   Chloride 103 98 - 111 mmol/L   CO2 25 22 - 32 mmol/L   Glucose, Bld 124 (H) 70 - 99 mg/dL    Comment: Glucose reference range applies only to samples taken after fasting for at least 8 hours.   BUN 15 6 - 20 mg/dL   Creatinine, Ser 7.25 0.61 - 1.24 mg/dL   Calcium 9.2 8.9 - 36.6 mg/dL  Total Protein 7.9 6.5 - 8.1 g/dL   Albumin 5.0 3.5 - 5.0 g/dL   AST 22 15 - 41 U/L   ALT 24 0 - 44 U/L   Alkaline Phosphatase 58 38 - 126 U/L   Total Bilirubin 0.8 0.3 - 1.2 mg/dL   GFR, Estimated >16 >10 mL/min    Comment: (NOTE) Calculated using the CKD-EPI Creatinine Equation (2021)    Anion gap 8 5 - 15    Comment: Performed at [facility], [address] [place], [place] [address]  Ethanol     Status: None   Collection Time: 02/03/23  4:59 PM  Result Value Ref Range   Alcohol, Ethyl (B) <10 <10 mg/dL    Comment: (NOTE) Lowest detectable limit for serum alcohol is 10 mg/dL.  For medical purposes only. Performed at [facility], [address] [place], [place] [address]   Salicylate level     Status: Abnormal   Collection Time: 02/03/23  4:59 PM  Result Value Ref Range   Salicylate Lvl <7.0 (L) 7.0 - 30.0 mg/dL    Comment: Performed at [facility], [address] [place], [place] [address]  Acetaminophen level     Status: Abnormal   Collection Time: 02/03/23  4:59 PM  Result Value Ref Range   Acetaminophen (Tylenol), Serum <10 (L) 10 - 30 ug/mL    Comment: (NOTE) Therapeutic concentrations vary significantly. A range of 10-30 ug/mL  may be an effective concentration for many patients. However, some  are best treated at concentrations outside of this range. Acetaminophen concentrations >150 ug/mL at 4 hours after ingestion  and >50 ug/mL at 12 hours after  ingestion are often associated with  toxic reactions.  Performed at [facility], [address] [place], [place] [address]   cbc     Status: Abnormal   Collection Time: 02/03/23  4:59 PM  Result Value Ref Range   WBC 14.0 (H) 4.0 - 10.5 K/uL   RBC 5.60 4.22 - 5.81 MIL/uL   Hemoglobin 16.8 13.0 - 17.0 g/dL   HCT 21.3 08.6 - 57.8 %   MCV 88.8 80.0 - 100.0 fL   MCH 30.0 26.0 - 34.0 pg   MCHC 33.8 30.0 - 36.0 g/dL   RDW 46.9 62.9 - 52.8 %   Platelets 244 150 - 400 K/uL   nRBC 0.0 0.0 - 0.2 %    Comment: Performed at [facility], [address] [place], [place] [address]  Urine Drug Screen, Qualitative     Status: Abnormal   Collection Time: 02/03/23  5:00 PM  Result Value Ref Range   Tricyclic, Ur Screen NONE DETECTED NONE DETECTED   Amphetamines, Ur Screen NONE DETECTED NONE DETECTED   MDMA (Ecstasy)Ur Screen NONE DETECTED NONE DETECTED   Cocaine Metabolite,Ur [place] NONE DETECTED NONE DETECTED   Opiate, Ur Screen NONE DETECTED NONE DETECTED   Phencyclidine (PCP) Ur S NONE DETECTED NONE DETECTED   Cannabinoid 50 Ng, Ur [place] POSITIVE (A) NONE DETECTED   Barbiturates, Ur Screen NONE DETECTED NONE DETECTED   Benzodiazepine, Ur Scrn NONE DETECTED NONE DETECTED   Methadone Scn, Ur NONE DETECTED NONE DETECTED    Comment: (NOTE) Tricyclics + metabolites, urine    Cutoff 1000 ng/mL Amphetamines + metabolites, urine  Cutoff 1000 ng/mL MDMA (Ecstasy), urine              Cutoff 500 ng/mL Cocaine Metabolite, urine          Cutoff  300 ng/mL Opiate + metabolites, urine        Cutoff 300 ng/mL Phencyclidine (PCP), urine         Cutoff 25 ng/mL Cannabinoid, urine                 Cutoff 50 ng/mL Barbiturates + metabolites, urine  Cutoff 200 ng/mL Benzodiazepine, urine              Cutoff 200 ng/mL Methadone, urine                   Cutoff 300 ng/mL  The urine drug screen provides only a preliminary, unconfirmed analytical test result and should not be used for  non-medical purposes. Clinical consideration and professional judgment should be applied to any positive drug screen result due to possible interfering substances. A more specific alternate chemical method must be used in order to obtain a confirmed analytical result. Gas chromatography / mass spectrometry (GC/MS) is the preferred confirm atory method. Performed at [facility], [address] [place], [place] [address]     Current Facility-Administered Medications  Medication Dose Route Frequency Provider Last Rate Last Admin   nicotine (NICODERM CQ - dosed in mg/24 hours) patch 21 mg  21 mg Transdermal Once Dionne Bucy, MD   21 mg at 02/03/23 2056   Current Outpatient Medications  Medication Sig Dispense Refill   traZODone (DESYREL) 50 MG tablet TAKE 1 TABLET BY MOUTH EVERYDAY AT BEDTIME 90 tablet 1    Musculoskeletal: Strength & Muscle Tone: within normal limits Gait & Station: normal Patient leans: N/A  Psychiatric Specialty Exam:  Presentation  General Appearance:  Appropriate for Environment  Eye Contact: Good  Speech: Clear and Coherent  Speech Volume: Normal  Handedness:No data recorded  Mood and Affect  Mood: Anxious  Affect: Appropriate   Thought Process  Thought Processes: Coherent  Descriptions of Associations:Intact  Orientation:Full (Time, Place and Person)  Thought Content:WDL; Logical  History of Schizophrenia/Schizoaffective disorder:No  Duration of Psychotic Symptoms:No data recorded Hallucinations:Hallucinations: None  Ideas of Reference:None  Suicidal Thoughts:Suicidal Thoughts: No  Homicidal Thoughts:Homicidal Thoughts: No   Sensorium  Memory: Immediate Good; Remote Good  Judgment: Fair  Insight: Good   Executive Functions  Concentration: Fair  Attention Span: Good  Recall: Good  Fund of Knowledge: Good  Language: Good   Psychomotor Activity  Psychomotor Activity: Psychomotor  Activity: Normal   Assets  Assets: Communication Skills; Desire for Improvement; Financial Resources/Insurance; Housing; Physical Health; Social Support   Sleep  Sleep: Sleep: Good   Physical Exam: Physical Exam Vitals and nursing note reviewed.  HENT:     Head: Normocephalic and atraumatic.     Nose: Nose normal.     Mouth/Throat:     Mouth: Mucous membranes are dry.  Eyes:     Extraocular Movements: Extraocular movements intact.  Musculoskeletal:        General: Normal range of motion.     Cervical back: Normal range of motion.  Skin:    General: Skin is dry.  Neurological:     Mental Status: He is oriented to person, place, and time.  Psychiatric:        Attention and Perception: Attention and perception normal.        Mood and Affect: Affect normal. Mood is anxious.        Speech: Speech normal.        Behavior: Behavior normal. Behavior is cooperative.        Thought Content:  Thought content normal.        Cognition and Memory: Cognition and memory normal.        Judgment: Judgment is impulsive.    Review of Systems  Psychiatric/Behavioral:  Negative for depression, hallucinations, substance abuse and suicidal ideas. The patient is not nervous/anxious.   All other systems reviewed and are negative.  Blood pressure 132/82, pulse 82, temperature 98.7 F (37.1 C), temperature source Oral, resp. rate 18, height 6' (1.829 m), weight 90.7 kg, SpO2 100 %. Body mass index is 27.12 kg/m.   Disposition: No evidence of imminent risk to self or others at present.   Patient does not meet criteria for psychiatric inpatient admission. Discussed crisis plan, support from social network, calling 911, coming to the Emergency Department, and calling Suicide Hotline. Safe for Discharge.    Jearld Lesch, NP 02/04/2023 12:07 AM
